# Patient Record
Sex: Male | Born: 1975 | Race: Black or African American | Hispanic: No | Marital: Single | Smoking: Current every day smoker
Health system: Southern US, Community
[De-identification: ages and names within clinical notes are randomized; demographics above are authoritative.]

## PROBLEM LIST (undated history)

## (undated) DIAGNOSIS — I10 Essential (primary) hypertension: Secondary | ICD-10-CM

---

## 2012-03-14 ENCOUNTER — Encounter (HOSPITAL_BASED_OUTPATIENT_CLINIC_OR_DEPARTMENT_OTHER): Payer: Self-pay | Admitting: *Deleted

## 2012-03-14 ENCOUNTER — Emergency Department (HOSPITAL_BASED_OUTPATIENT_CLINIC_OR_DEPARTMENT_OTHER)
Admission: EM | Admit: 2012-03-14 | Discharge: 2012-03-15 | Disposition: A | Discharge status: Home / Self Care | Payer: Self-pay | Attending: Emergency Medicine | Admitting: Emergency Medicine

## 2012-03-14 DIAGNOSIS — R5383 Other fatigue: Secondary | ICD-10-CM | POA: Insufficient documentation

## 2012-03-14 DIAGNOSIS — R5381 Other malaise: Secondary | ICD-10-CM | POA: Insufficient documentation

## 2012-03-14 DIAGNOSIS — R509 Fever, unspecified: Secondary | ICD-10-CM | POA: Insufficient documentation

## 2012-03-14 DIAGNOSIS — J029 Acute pharyngitis, unspecified: Secondary | ICD-10-CM | POA: Insufficient documentation

## 2012-03-14 DIAGNOSIS — R599 Enlarged lymph nodes, unspecified: Secondary | ICD-10-CM | POA: Insufficient documentation

## 2012-03-14 LAB — RAPID STREP SCREEN (MED CTR MEBANE ONLY): Streptococcus, Group A Screen (Direct): NEGATIVE

## 2012-03-14 NOTE — ED Notes (Signed)
Pt c/o sore throat and weakness x 3 days

## 2012-03-14 NOTE — ED Provider Notes (Signed)
History     CSN: [id number]  Arrival date & time 03/14/12  2305   First MD Initiated Contact with Patient 03/14/12 2317      Chief Complaint  Patient presents with  . Sore Throat    (Consider location/radiation/quality/duration/timing/severity/associated sxs/prior treatment) HPI Is a 36 year old black male with a three-day history of sore throat, chills, subjective fever and weakness. The pain in his throat is moderate to severe, worse with swallowing. He has had some purulent blood-streaked sputum. He is also complaining of swollen glands in the anterior neck.  History reviewed. No pertinent past medical history.  History reviewed. No pertinent past surgical history.  History reviewed. No pertinent family history.  History  Substance Use Topics  . Smoking status: Current Everyday Smoker -- 0.5 packs/day  . Smokeless tobacco: Not on file  . Alcohol Use: No      Review of Systems  All other systems reviewed and are negative.    Allergies  Review of patient's allergies indicates no known allergies.  Home Medications  No current outpatient prescriptions on file.  BP 138/95  Pulse 83  Temp(Src) 98.4 F (36.9 C) (Oral)  Resp 16  Ht 6' (1.829 m)  Wt 205 lb (92.987 kg)  BMI 27.80 kg/m2  SpO2 100%  Physical Exam General: Well-developed, well-nourished male in no acute distress; appearance consistent with age of record HENT: normocephalic, atraumatic; pharyngeal erythema and edema, uvula midline, no trismus Eyes: pupils equal round and reactive to light; extraocular muscles intact Neck: supple; anterior cervical lymphadenopathy Heart: regular rate and rhythm; no murmurs, rubs or gallops Lungs: clear to auscultation bilaterally Abdomen: soft; nondistended Extremities: No deformity; full range of motion Neurologic: Awake, alert and oriented; motor function intact in all extremities and symmetric; no facial droop Skin: Warm and dry Psychiatric: Normal mood and  affect    ED Course  Procedures (including critical care time)     MDM   Nursing notes and vitals signs, including pulse oximetry, reviewed.  Summary of this visit's results, reviewed by myself:  Labs:  Results for orders placed during the hospital encounter of 03/14/12  RAPID STREP SCREEN      Component Value Range   Streptococcus, Group A Screen (Direct) NEGATIVE  NEGATIVE            Hanley Seamen, MD 03/15/12 5027

## 2012-03-15 MED ORDER — DEXAMETHASONE SODIUM PHOSPHATE 10 MG/ML IJ SOLN: 10.0000 mg | Freq: Once | INTRAMUSCULAR | Status: DC

## 2012-03-15 MED ORDER — HYDROCODONE-ACETAMINOPHEN 5-325 MG PO TABS
1.0000 | ORAL_TABLET | Freq: Once | ORAL | Status: AC
  Administered 2012-03-15: 1 via ORAL
  Filled 2012-03-15: qty 1

## 2012-03-15 MED ORDER — HYDROCODONE-ACETAMINOPHEN 10-500 MG/15ML PO SOLN: 10.0000 mL | Freq: Four times a day (QID) | ORAL | Status: AC | PRN

## 2012-03-15 MED ORDER — DEXAMETHASONE SODIUM PHOSPHATE 10 MG/ML IJ SOLN
10.0000 mg | Freq: Once | INTRAMUSCULAR | Status: AC
  Administered 2012-03-15: 10 mg via INTRAMUSCULAR
  Filled 2012-03-15: qty 1

## 2017-05-04 ENCOUNTER — Emergency Department (HOSPITAL_BASED_OUTPATIENT_CLINIC_OR_DEPARTMENT_OTHER)
Admission: EM | Admit: 2017-05-04 | Discharge: 2017-05-04 | Disposition: A | Discharge status: Home / Self Care | Payer: Self-pay | Attending: Emergency Medicine | Admitting: Emergency Medicine

## 2017-05-04 ENCOUNTER — Emergency Department (HOSPITAL_BASED_OUTPATIENT_CLINIC_OR_DEPARTMENT_OTHER): Payer: Self-pay

## 2017-05-04 ENCOUNTER — Encounter (HOSPITAL_BASED_OUTPATIENT_CLINIC_OR_DEPARTMENT_OTHER): Payer: Self-pay | Admitting: Emergency Medicine

## 2017-05-04 DIAGNOSIS — F17200 Nicotine dependence, unspecified, uncomplicated: Secondary | ICD-10-CM | POA: Insufficient documentation

## 2017-05-04 DIAGNOSIS — I10 Essential (primary) hypertension: Secondary | ICD-10-CM | POA: Insufficient documentation

## 2017-05-04 DIAGNOSIS — M5414 Radiculopathy, thoracic region: Secondary | ICD-10-CM

## 2017-05-04 DIAGNOSIS — M79601 Pain in right arm: Secondary | ICD-10-CM | POA: Insufficient documentation

## 2017-05-04 HISTORY — DX: Essential (primary) hypertension: I10

## 2017-05-04 MED ORDER — PREDNISONE 10 MG PO TABS: 20.0000 mg | ORAL_TABLET | Freq: Two times a day (BID) | ORAL | 0 refills | Status: AC

## 2017-05-04 MED ORDER — KETOROLAC TROMETHAMINE 60 MG/2ML IM SOLN
60.0000 mg | Freq: Once | INTRAMUSCULAR | Status: AC
  Administered 2017-05-04: 60 mg via INTRAMUSCULAR
  Filled 2017-05-04: qty 2

## 2017-05-04 MED ORDER — TRAMADOL HCL 50 MG PO TABS: 50.0000 mg | ORAL_TABLET | Freq: Four times a day (QID) | ORAL | 0 refills | Status: AC | PRN

## 2017-05-04 NOTE — ED Notes (Signed)
Patient transported to X-ray 

## 2017-05-04 NOTE — ED Notes (Signed)
ED Provider at bedside. 

## 2017-05-04 NOTE — ED Provider Notes (Signed)
[facility]   CSN: [id number] Arrival date & time: 05/04/17  0125     History   Chief Complaint Chief Complaint  Patient presents with  . Arm Pain    HPI Tyler Reilly is a 41 y.o. male.  Patient is a 41 year old male with no significant past medical history. He presents for evaluation of right posterior shoulder pain that has been worsening over the past 2 months. He denies any specific injury or trauma. His pain is in the soft tissues overlying his scapula with numbness radiating into the right arm. He reports a "pins and needles sensation" to the right forearm and hand. He denies any weakness. His symptoms are worse when he tries to lie down to sleep and are improved when he is sitting upright or standing.   The history is provided by the patient.  Arm Pain  This is a new problem. Episode onset: 2 months ago. The problem occurs constantly. The problem has been gradually worsening. Pertinent negatives include no chest pain, no abdominal pain and no shortness of breath. Exacerbated by: Movement and palpation, lying flat. Nothing relieves the symptoms. Treatments tried: Ibuprofen. The treatment provided no relief.    Past Medical History:  Diagnosis Date  . Hypertension     There are no active problems to display for this patient.   History reviewed. No pertinent surgical history.     Home Medications    Prior to Admission medications   Medication Sig Start Date End Date Taking? Authorizing Provider  Hydrocodone-Acetaminophen 10-500 MG/15ML SOLN Take 10 mLs by mouth 4 (four) times daily as needed. 03/15/12   Molpus, John, MD    Family History No family history on file.  Social History Social History  Substance Use Topics  . Smoking status: Current Every Day Smoker    Packs/day: 1.00  . Smokeless tobacco: Never Used  . Alcohol use Yes     Comment: socially     Allergies   Patient has no known allergies.   Review of  Systems Review of Systems  Respiratory: Negative for shortness of breath.   Cardiovascular: Negative for chest pain.  Gastrointestinal: Negative for abdominal pain.  All other systems reviewed and are negative.    Physical Exam Updated Vital Signs BP (!) 154/103 (BP Location: Right Arm)   Pulse 88   Temp 98.4 F (36.9 C) (Oral)   Resp 16   Ht 6' (1.829 m)   Wt 81.6 kg (180 lb)   SpO2 99%   BMI 24.41 kg/m   Physical Exam  Constitutional: He is oriented to person, place, and time. He appears well-developed and well-nourished. No distress.  HENT:  Head: Normocephalic and atraumatic.  Mouth/Throat: Oropharynx is clear and moist.  Neck: Normal range of motion. Neck supple.  Cardiovascular: Normal rate and regular rhythm.  Exam reveals no friction rub.   No murmur heard. Pulmonary/Chest: Effort normal and breath sounds normal. No respiratory distress. He has no wheezes. He has no rales.  Abdominal: Soft. Bowel sounds are normal. He exhibits no distension. There is no tenderness.  Musculoskeletal: Normal range of motion. He exhibits no edema.  There is tenderness to palpation of the soft tissues overlying the scapula. There is no obvious abnormality. He has good range of motion of the right shoulder. Is able to flex, extend, and oppose all fingers to the right hand. Sensation is intact around the entire hand. Ulnar and radial pulses are easily palpable.  Neurological: He is alert  and oriented to person, place, and time. Coordination normal.  Skin: Skin is warm and dry. He is not diaphoretic.  Nursing note and vitals reviewed.    ED Treatments / Results  Labs (all labs ordered are listed, but only abnormal results are displayed) Labs Reviewed - No data to display  EKG  EKG Interpretation None       Radiology No results found.  Procedures Procedures (including critical care time)  Medications Ordered in ED Medications  ketorolac (TORADOL) injection 60 mg (not  administered)     Initial Impression / Assessment and Plan / ED Course  I have reviewed the triage vital signs and the nursing notes.  Pertinent labs & imaging results that were available during my care of the patient were reviewed by me and considered in my medical decision making (see chart for details).  Patient's x-rays are unremarkable and physical examination reveals nothing that appears acute. He seems to have some sort of radicular pain. This will be treated with prednisone, tramadol, and the patient is to follow-up with primary Dr. if not improving in the next 1-2 weeks.  Final Clinical Impressions(s) / ED Diagnoses   Final diagnoses:  None    New Prescriptions New Prescriptions   No medications on file     Geoffery Lyonselo, Loveta Dellis, MD 05/04/17 [phone]

## 2017-05-04 NOTE — Discharge Instructions (Signed)
Prednisone as prescribed.  Tramadol as prescribed.  Follow-up with your primary Dr. if you're not improving in the next 1-2 weeks to discuss further imaging or possibly referrals to physical therapy.

## 2017-05-04 NOTE — ED Triage Notes (Addendum)
Tingling and pain to right arm and right side of back x1-2 months when he lays down.  Not when standing. Pain is getting worse.  Sts he cannot  sleep now. No known injury.

## 2017-09-30 DEATH — deceased

## 2018-11-24 IMAGING — CR DG SHOULDER 2+V*R*
3 series · 3 of 3 positions shown · non-contrast
Comparison: None.

CLINICAL DATA: Right shoulder/ back pain. Right scapular pain
radiating to the right upper extremity for 1-2 months, progressive.

EXAM:
RIGHT SHOULDER - 2+ VIEW

[w shoulder grashey right]
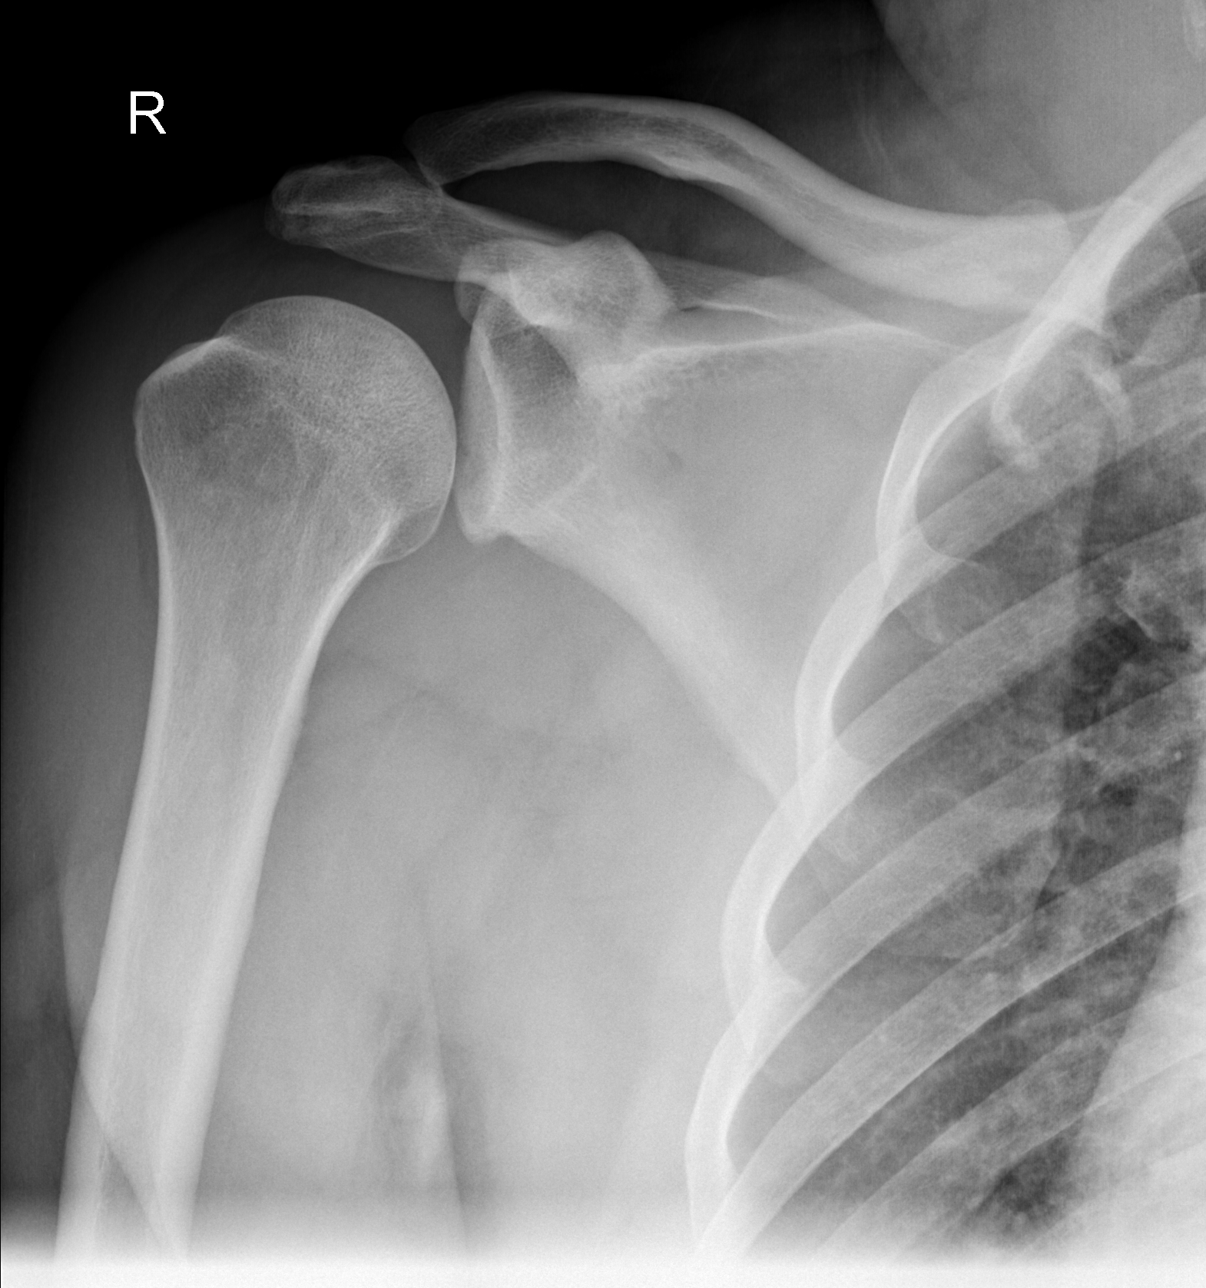

[w shoulder y view right]
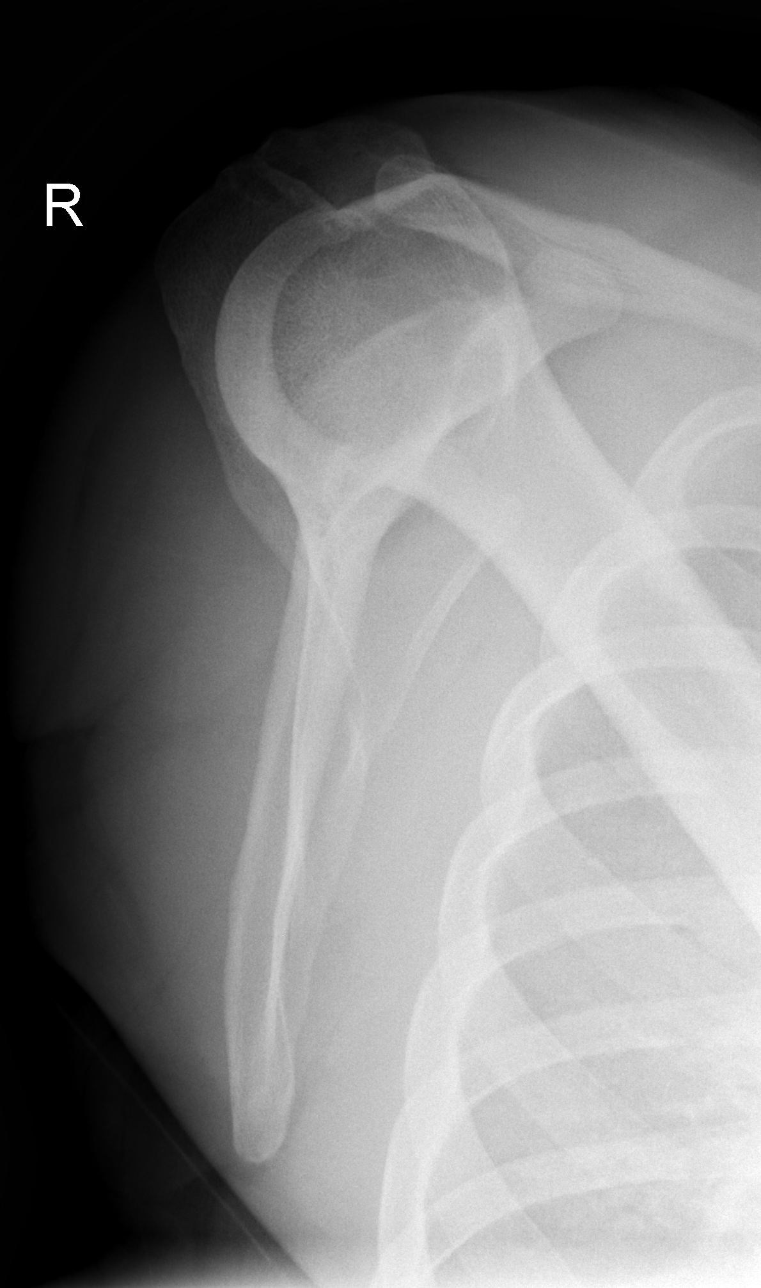

[x shoulder axillary right *]
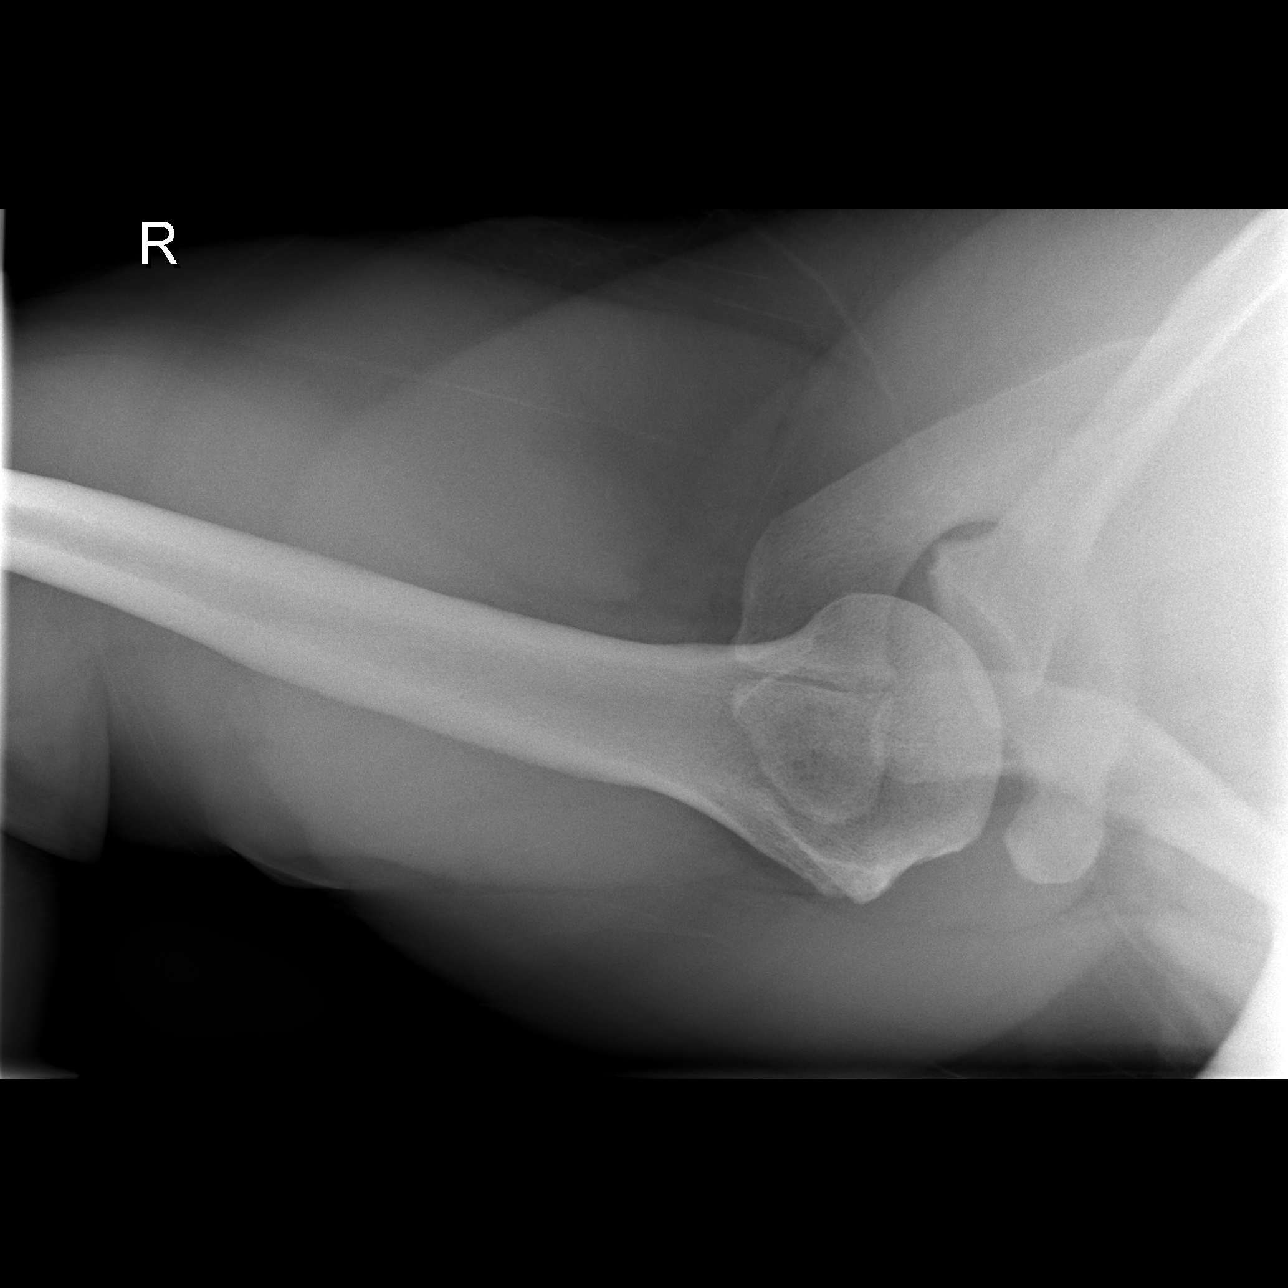

[3 of 3 positions shown; findings below may reference images not displayed]

FINDINGS: There is no evidence of fracture or dislocation. There is no
evidence of arthropathy. There is an os acromial. Soft tissues are
unremarkable.
IMPRESSION: Os acromial.  No acute osseous abnormality.

## 2018-11-24 IMAGING — CR DG CHEST 2V
2 series · 2 of 2 positions shown · non-contrast
Comparison: None.

CLINICAL DATA: Right shoulder and back pain. Right scapular pain
radiating to right upper extremity for 1-2 months, progressive pain.

EXAM:
CHEST  2 VIEW

[w chest pa]
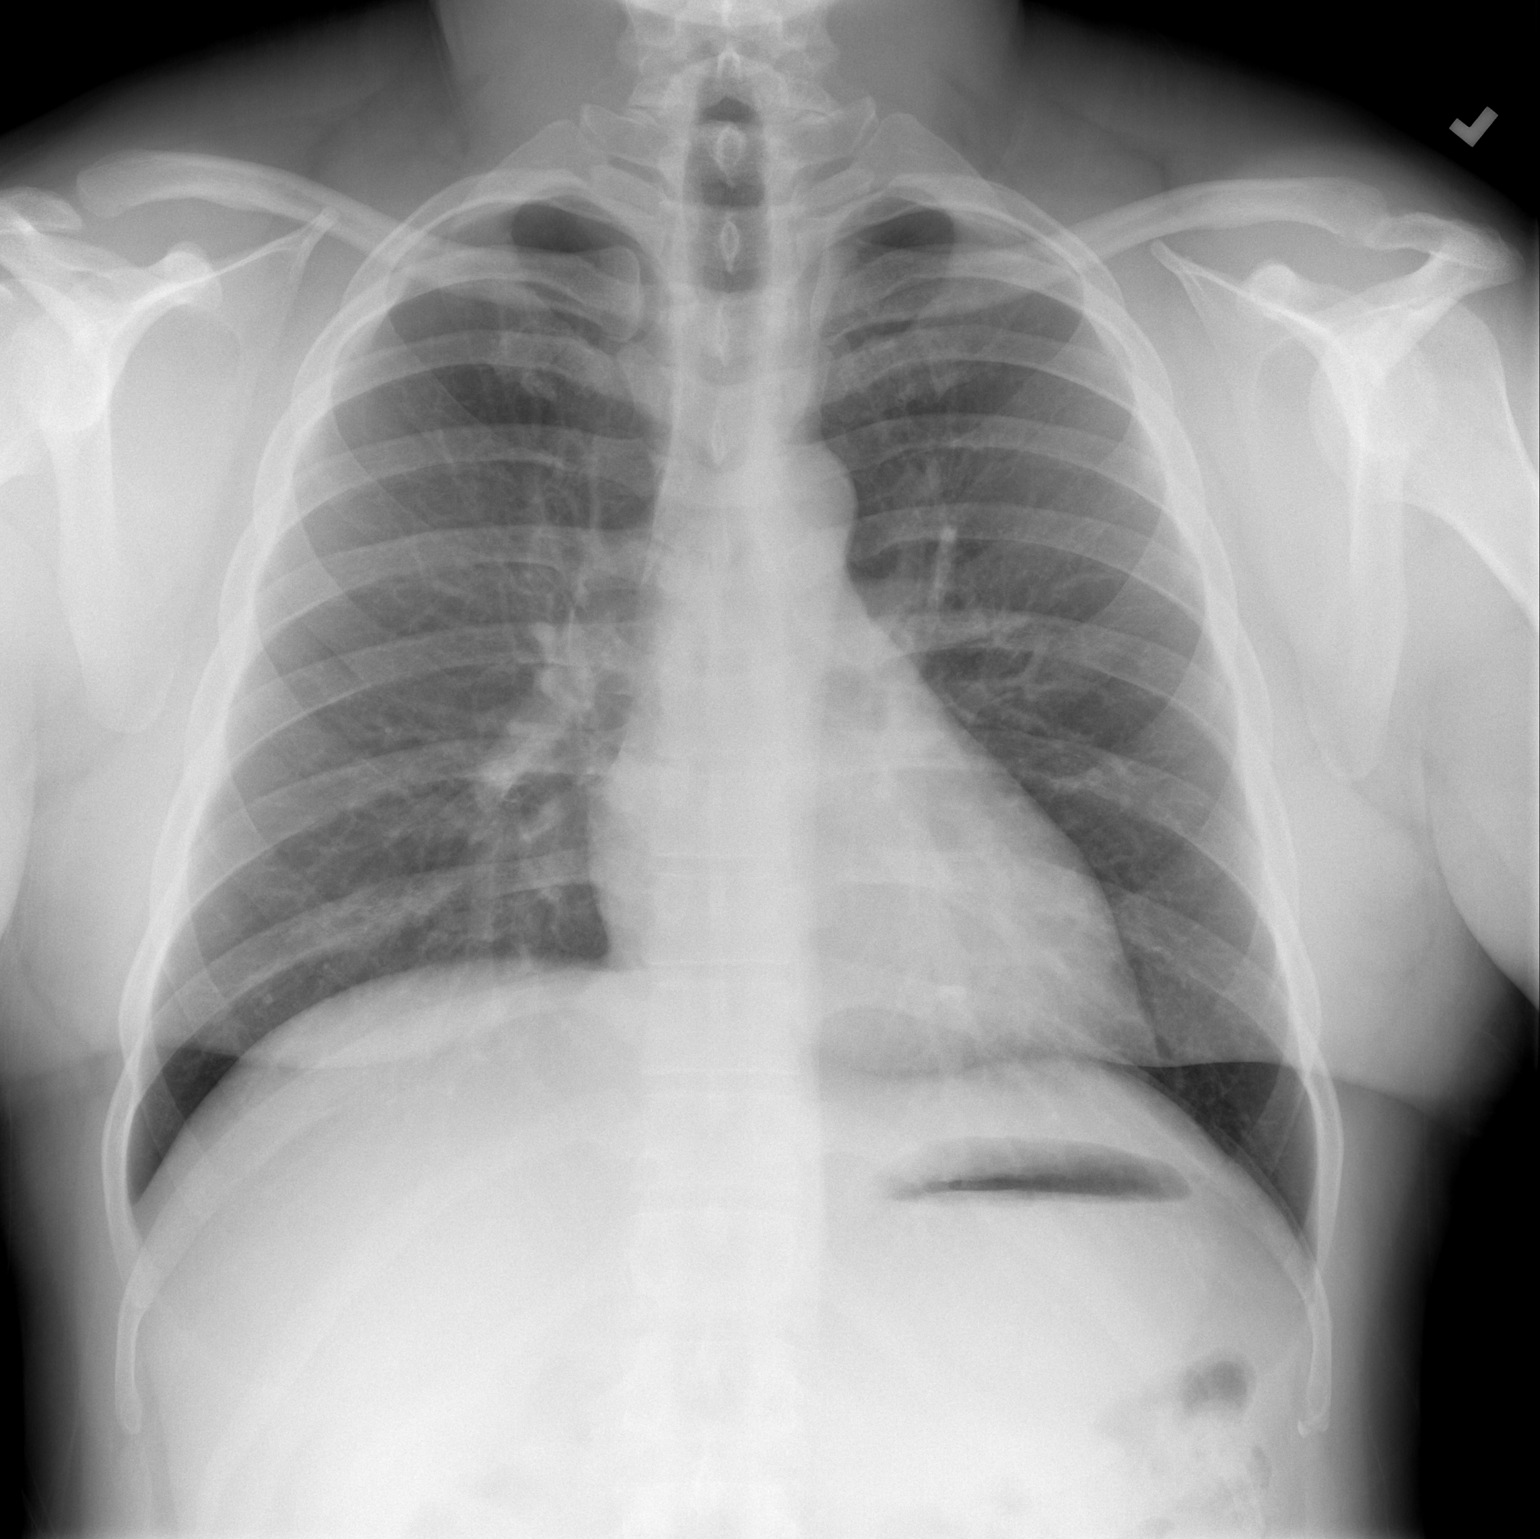

[w chest lat]
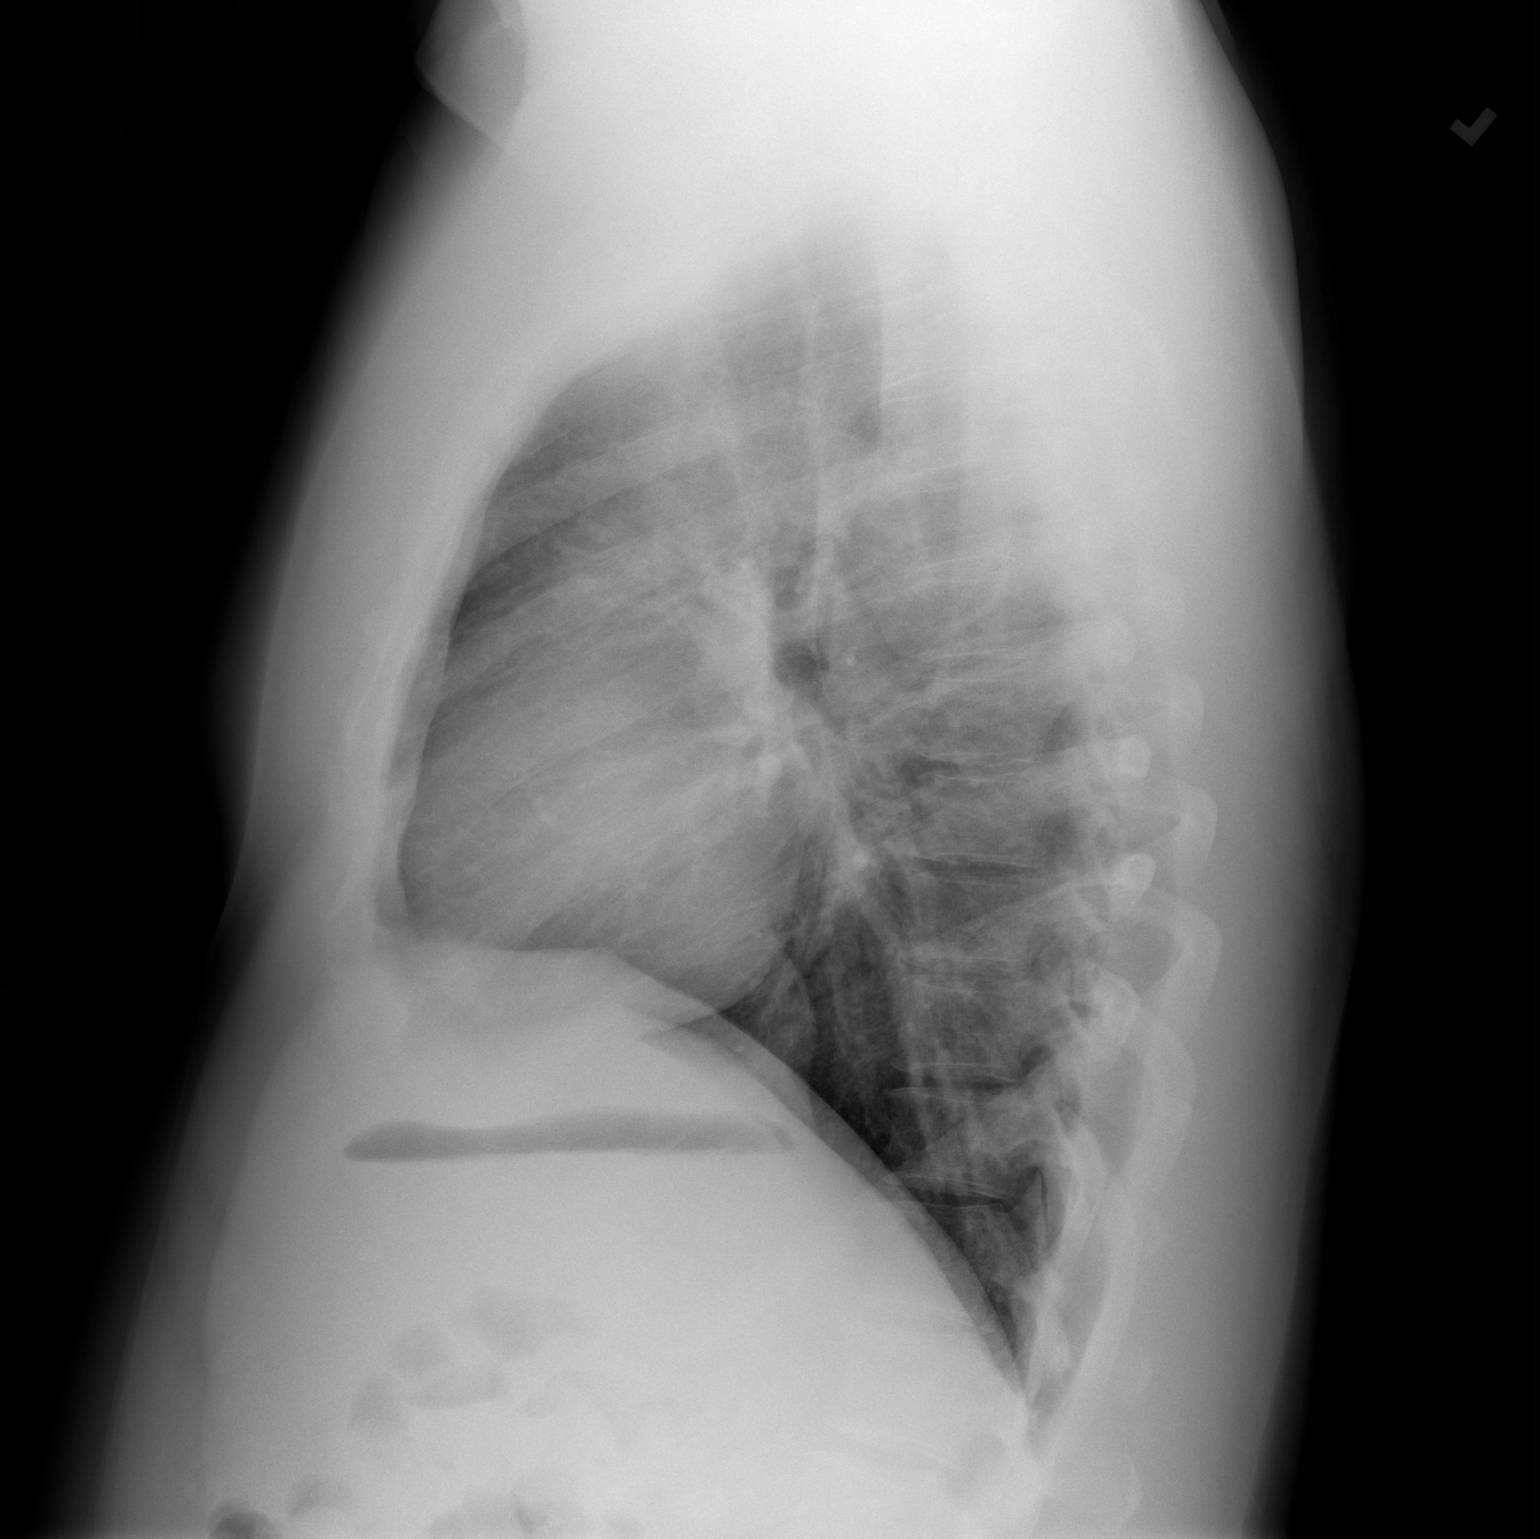

[2 of 2 positions shown; findings below may reference images not displayed]

FINDINGS: The cardiomediastinal contours are normal. The lungs are clear.
Pulmonary vasculature is normal. No consolidation, pleural effusion,
or pneumothorax. No acute osseous abnormalities are seen.
IMPRESSION: No acute abnormality or explanation for right-sided pain.
